# Patient Record
Sex: Female | Born: 2000 | Race: White | Hispanic: No | Marital: Single | Smoking: Never smoker
Health system: Southern US, Community
[De-identification: ages and names within clinical notes are randomized; demographics above are authoritative.]

---
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
## 2022-05-30 DIAGNOSIS — R42 Dizziness and giddiness: Secondary | ICD-10-CM | POA: Diagnosis not present

## 2022-05-30 DIAGNOSIS — F419 Anxiety disorder, unspecified: Secondary | ICD-10-CM | POA: Diagnosis not present

## 2022-07-15 DIAGNOSIS — G90A Postural orthostatic tachycardia syndrome (POTS): Secondary | ICD-10-CM | POA: Diagnosis not present

## 2022-07-15 DIAGNOSIS — Z6827 Body mass index (BMI) 27.0-27.9, adult: Secondary | ICD-10-CM | POA: Diagnosis not present

## 2022-07-15 DIAGNOSIS — M25512 Pain in left shoulder: Secondary | ICD-10-CM | POA: Diagnosis not present

## 2022-07-15 DIAGNOSIS — R55 Syncope and collapse: Secondary | ICD-10-CM | POA: Diagnosis not present

## 2022-07-17 DIAGNOSIS — Z79899 Other long term (current) drug therapy: Secondary | ICD-10-CM | POA: Diagnosis not present

## 2022-07-17 DIAGNOSIS — L700 Acne vulgaris: Secondary | ICD-10-CM | POA: Diagnosis not present

## 2022-08-08 ENCOUNTER — Encounter: Payer: Self-pay | Admitting: Internal Medicine

## 2022-08-08 ENCOUNTER — Ambulatory Visit: Payer: BC Managed Care – PPO | Attending: Internal Medicine | Admitting: Internal Medicine

## 2022-08-08 ENCOUNTER — Telehealth: Payer: Self-pay | Admitting: Internal Medicine

## 2022-08-08 ENCOUNTER — Other Ambulatory Visit: Payer: BC Managed Care – PPO

## 2022-08-08 ENCOUNTER — Other Ambulatory Visit: Payer: Self-pay | Admitting: Internal Medicine

## 2022-08-08 ENCOUNTER — Encounter: Payer: Self-pay | Admitting: *Deleted

## 2022-08-08 VITALS — BP 120/70 | HR 69 | Ht 65.0 in | Wt 166.6 lb

## 2022-08-08 DIAGNOSIS — R55 Syncope and collapse: Secondary | ICD-10-CM | POA: Diagnosis not present

## 2022-08-08 DIAGNOSIS — R42 Dizziness and giddiness: Secondary | ICD-10-CM

## 2022-08-08 NOTE — Patient Instructions (Addendum)
Medication Instructions:  Your physician recommends that you continue on your current medications as directed. Please refer to the Current Medication list given to you today.  Labwork: none  Testing/Procedures: Your physician has requested that you have an exercise tolerance test. For further information please visit [email or web address] Please also follow instruction sheet, as given. Your physician has recommended that you wear a Zio monitor.   This monitor is a medical device that records the heart's electrical activity. Doctors most often use these monitors to diagnose arrhythmias. Arrhythmias are problems with the speed or rhythm of the heartbeat. The monitor is a small device applied to your chest. You can wear one while you do your normal daily activities. While wearing this monitor if you have any symptoms to push the button and record what you felt. Once you have worn this monitor for the period of time provider prescribed (for 14 days), you will return the monitor device in the postage paid box. Once it is returned they will download the data collected and provide [place] with a report which the provider will then review and we will call you with those results. Important tips:  Avoid showering during the first 24 hours of wearing the monitor. Avoid excessive sweating to help maximize wear time. Do not submerge the device, no hot tubs, and no swimming pools. Keep any lotions or oils away from the patch. After 24 hours you may shower with the patch on. Take brief showers with your back facing the shower head.  Do not remove patch once it has been placed because that will interrupt data and decrease adhesive wear time. Push the button when you have any symptoms and write down what you were feeling. Once you have completed wearing your monitor, remove and place into box which has postage paid and place in your outgoing mailbox.  If for some reason you have misplaced your box then call our office  and we can provide another box and/or mail it off for you.  Follow-Up: Your physician recommends that you schedule a follow-up appointment in: pending  Any Other Special Instructions Will Be Listed Below (If Applicable).  If you need a refill on your cardiac medications before your next appointment, please call your pharmacy.

## 2022-08-08 NOTE — Progress Notes (Signed)
Cardiology Office Note  Date: 08/08/2022   ID: CHARLICE CHAUCA, DOB Jul 20, 2000, MRN [id number]  PCP:  Royann Shivers, PA-C  Cardiologist:  Marjo Bicker, MD Electrophysiologist:  None   Reason for Office Visit: Evaluation of presyncope   History of Present Illness: Tamelia E Boothby is a 22 y.o. female with no PMH was referred to cardiology clinic for evaluation of presyncope.  Patient has been having presyncopal episodes for quite a while.  She usually gets these episodes when she stands up too quickly, mainly with exertion and sometimes even with rest.  Hence she is not able to exercise due to fear of dizziness with exertion.  She feels like she is about to pass out but never had a syncopal episode. Associated with palpitations.  No DOE, leg swelling.  Does not drink coffee, energy drinks, alcohol, does not take herbal supplements.   Current Outpatient Medications  Medication Sig Dispense Refill   albuterol (PROVENTIL HFA;VENTOLIN HFA) 108 (90 Base) MCG/ACT inhaler Use 2 puffs q 4 hours prn wheezing 2 Inhaler 1   ampicillin (PRINCIPEN) 500 MG capsule Take 500 mg by mouth daily.     naproxen (NAPROSYN) 500 MG tablet Take 500 mg by mouth 2 (two) times daily.     No current facility-administered medications for this visit.   Allergies:  Patient has no known allergies.   Social History: The patient  reports that she has never smoked. She has never used smokeless tobacco. She reports that she does not drink alcohol and does not use drugs.   Family History: The patient's family history includes Anxiety disorder in her mother; COPD in her maternal grandfather; Cancer in her maternal grandmother and mother; Diabetes in her maternal grandfather; Heart failure in her maternal grandfather; Hypertension in her paternal grandmother; Seizures in her sister.   ROS:  Please see the history of present illness. Otherwise, complete review of systems is positive for none  All other  systems are reviewed and negative.   Physical Exam: VS:  BP 120/70   Pulse 69   Ht 5\' 5"  (1.651 m)   Wt 166 lb 9.6 oz (75.6 kg)   SpO2 98%   BMI 27.72 kg/m , BMI Body mass index is 27.72 kg/m.  Wt Readings from Last 3 Encounters:  08/08/22 166 lb 9.6 oz (75.6 kg)  04/26/18 155 lb (70.3 kg) (90 %, Z= 1.27)*  04/09/18 156 lb 12.8 oz (71.1 kg) (91 %, Z= 1.32)*   * Growth percentiles are based on CDC (Girls, 2-20 Years) data.    General: Patient appears comfortable at rest. HEENT: Conjunctiva and lids normal, oropharynx clear with moist mucosa. Neck: Supple, no elevated JVP or carotid bruits, no thyromegaly. Lungs: Clear to auscultation, nonlabored breathing at rest. Cardiac: Regular rate and rhythm, no S3 or significant systolic murmur, no pericardial rub. Abdomen: Soft, nontender, no hepatomegaly, bowel sounds present, no guarding or rebound. Extremities: No pitting edema, distal pulses 2+. Skin: Warm and dry. Musculoskeletal: No kyphosis. Neuropsychiatric: Alert and oriented x3, affect grossly appropriate.  Recent Labwork: No results found for requested labs within last 365 days.  No results found for: "CHOL", "TRIG", "HDL", "CHOLHDL", "VLDL", "LDLCALC", "LDLDIRECT"  Other Studies Reviewed Today:   Assessment and Plan:  Patient is a 22 year old F with no PMH was referred to cardiology clinic for evaluation presyncope.  # Presyncope -EKG from the PCPs office reviewed, NSR and no evidence of preexcitation. Orthostatic vitals today in the clinic were negative for orthostatic  hypotension and POTS. -Obtain exercise tolerance test to rule out any exertional arrhythmias -Obtain 2-week event monitor -Treat underlying anxiety   I have spent a total of 30 minutes with patient reviewing chart, EKGs, labs and examining patient as well as establishing an assessment and plan that was discussed with the patient.  > 50% of time was spent in direct patient care.    Medication  Adjustments/Labs and Tests Ordered: Current medicines are reviewed at length with the patient today.  Concerns regarding medicines are outlined above.   Tests Ordered: Orders Placed This Encounter  Procedures   EXERCISE TOLERANCE TEST (ETT)    Medication Changes: No orders of the defined types were placed in this encounter.   Disposition:  Follow up  pending results  Signed Corda Shutt Verne Spurr, MD, 08/08/2022 9:48 AM    [facility] [address] [place], [place], [place] [address]

## 2022-08-08 NOTE — Telephone Encounter (Signed)
Checking percert on the following patient for testing scheduled at [facility].   GXT dx: presyncope   08-11-2022  14 Day ZIO AT dx: presyncope

## 2022-08-11 ENCOUNTER — Ambulatory Visit (HOSPITAL_COMMUNITY)
Admission: RE | Admit: 2022-08-11 | Discharge: 2022-08-11 | Disposition: A | Discharge status: Home / Self Care | Payer: BC Managed Care – PPO | Source: Ambulatory Visit | Attending: Internal Medicine | Admitting: Internal Medicine

## 2022-08-11 DIAGNOSIS — R55 Syncope and collapse: Secondary | ICD-10-CM

## 2022-08-11 DIAGNOSIS — R42 Dizziness and giddiness: Secondary | ICD-10-CM | POA: Insufficient documentation

## 2022-08-11 LAB — EXERCISE TOLERANCE TEST
Angina Index: 0
Duke Treadmill Score: 8
Estimated workload: 10.1
Exercise duration (min): 8 min
Exercise duration (sec): 20 s
MPHR: 200 {beats}/min
Peak HR: 187 {beats}/min
Percent HR: 93 %
RPE: 15
Rest HR: 88 {beats}/min
ST Depression (mm): 0 mm

## 2022-08-19 DIAGNOSIS — L700 Acne vulgaris: Secondary | ICD-10-CM | POA: Diagnosis not present

## 2022-08-19 DIAGNOSIS — Z79899 Other long term (current) drug therapy: Secondary | ICD-10-CM | POA: Diagnosis not present

## 2022-09-22 DIAGNOSIS — Z79899 Other long term (current) drug therapy: Secondary | ICD-10-CM | POA: Diagnosis not present

## 2022-09-22 DIAGNOSIS — L700 Acne vulgaris: Secondary | ICD-10-CM | POA: Diagnosis not present
